# Patient Record
Sex: Male | Born: 2000
Health system: Southern US, Community
[De-identification: ages and names within clinical notes are randomized; demographics above are authoritative.]

## PROBLEM LIST (undated history)

## (undated) DIAGNOSIS — H539 Unspecified visual disturbance: Secondary | ICD-10-CM

---
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
## 2019-01-10 ENCOUNTER — Encounter (HOSPITAL_BASED_OUTPATIENT_CLINIC_OR_DEPARTMENT_OTHER): Payer: Self-pay | Admitting: *Deleted

## 2019-01-10 ENCOUNTER — Emergency Department (HOSPITAL_BASED_OUTPATIENT_CLINIC_OR_DEPARTMENT_OTHER)
Admission: EM | Admit: 2019-01-10 | Discharge: 2019-01-10 | Disposition: A | Discharge status: Home / Self Care | Payer: MEDICAID | Attending: Emergency Medicine | Admitting: Emergency Medicine

## 2019-01-10 ENCOUNTER — Other Ambulatory Visit: Payer: Self-pay

## 2019-01-10 ENCOUNTER — Emergency Department (HOSPITAL_BASED_OUTPATIENT_CLINIC_OR_DEPARTMENT_OTHER): Payer: MEDICAID

## 2019-01-10 DIAGNOSIS — R69 Illness, unspecified: Secondary | ICD-10-CM

## 2019-01-10 DIAGNOSIS — F17200 Nicotine dependence, unspecified, uncomplicated: Secondary | ICD-10-CM | POA: Insufficient documentation

## 2019-01-10 DIAGNOSIS — J111 Influenza due to unidentified influenza virus with other respiratory manifestations: Secondary | ICD-10-CM | POA: Insufficient documentation

## 2019-01-10 LAB — INFLUENZA PANEL BY PCR (TYPE A & B)
Influenza A By PCR: NEGATIVE
Influenza B By PCR: NEGATIVE

## 2019-01-10 LAB — GROUP A STREP BY PCR: Group A Strep by PCR: NOT DETECTED

## 2019-01-10 MED ORDER — OSELTAMIVIR PHOSPHATE 75 MG PO CAPS: 75.0000 mg | ORAL_CAPSULE | Freq: Two times a day (BID) | ORAL | 0 refills | Status: DC

## 2019-01-10 MED ORDER — ACETAMINOPHEN 325 MG PO TABS
650.0000 mg | ORAL_TABLET | Freq: Once | ORAL | Status: AC
  Administered 2019-01-10: 650 mg via ORAL
  Filled 2019-01-10: qty 2

## 2019-01-10 MED ORDER — IBUPROFEN 800 MG PO TABS
800.0000 mg | ORAL_TABLET | Freq: Once | ORAL | Status: AC
  Administered 2019-01-10: 800 mg via ORAL
  Filled 2019-01-10: qty 1

## 2019-01-10 NOTE — ED Provider Notes (Signed)
[facility] EMERGENCY DEPARTMENT Provider Note   CSN: [id number] Arrival date & time: 01/10/19  1733    History   Chief Complaint Chief Complaint  Patient presents with  . Fever  . Generalized Body Aches    HPI Bryan Ayala is a 18 y.o. male with history of tobacco abuse who presents to the emergency department with complaints of flulike symptoms that began last evening and have been progressively worsening.  Patient reports congestion, sore throat, minimal productive cough, generalized body aches, subjective fever, and chills.  No alleviating or aggravating factors.  No intervention prior to arrival.  Denies recent travel or known exposures to Covid 19.  Denies nausea, vomiting, abdominal pain, diarrhea, chest pain, or dyspnea.     HPI  Past Medical History:  Diagnosis Date  . Vision abnormalities    wears glasses    Patient Active Problem List   Diagnosis Date Noted  . Post inflammatory hypopigmentation 04/23/2018    Past Surgical History:  Procedure Laterality Date  . APPENDECTOMY  05/13/2012   non-ruptured  . LAPAROSCOPIC APPENDECTOMY  05/12/2012   Procedure: APPENDECTOMY LAPAROSCOPIC;  Surgeon: Judie Petit. Leonia Corona, MD;  Location: [facility] OR;  Service: Pediatrics;  Laterality: N/A;        Home Medications    Prior to Admission medications   Not on File    Family History Family History  Problem Relation Age of Onset  . Asthma Maternal Aunt   . Cancer Maternal Aunt   . Alcohol abuse Maternal Uncle   . Alcohol abuse Paternal Uncle   . Cancer Maternal Grandmother        breast cancer  . Miscarriages / Stillbirths Maternal Grandmother   . Cancer Maternal Grandfather        prostate  . Hypertension Maternal Grandfather     Social History Social History   Tobacco Use  . Smoking status: Current Every Day Smoker  . Smokeless tobacco: Never Used  Substance Use Topics  . Alcohol use: No  . Drug use: No     Allergies   Patient has no known  allergies.   Review of Systems Review of Systems  Constitutional: Positive for chills and fever.  HENT: Positive for congestion and sore throat. Negative for ear pain.   Respiratory: Positive for cough. Negative for shortness of breath.   Cardiovascular: Negative for chest pain.  Gastrointestinal: Negative for abdominal pain, diarrhea, nausea and vomiting.  Musculoskeletal: Positive for myalgias.  All other systems reviewed and are negative.    Physical Exam Updated Vital Signs BP (!) 149/79   Pulse (!) 108   Temp (!) 103.2 F (39.6 C) (Oral)   Resp 20   Ht 6\' 1"  (1.854 m)   Wt 76.5 kg   SpO2 98%   BMI 22.24 kg/m   Physical Exam Vitals signs and nursing note reviewed.  Constitutional:      General: He is not in acute distress.    Appearance: He is well-developed. He is not toxic-appearing.  HENT:     Head: Normocephalic and atraumatic.     Right Ear: Ear canal and external ear normal. Tympanic membrane is not perforated, erythematous, retracted or bulging.     Left Ear: Ear canal and external ear normal. Tympanic membrane is not perforated, erythematous, retracted or bulging.     Ears:     Comments: No mastoid erythema, swelling, or tenderness to palpation.    Nose: Congestion present.     Right Sinus: No maxillary  sinus tenderness or frontal sinus tenderness.     Left Sinus: No maxillary sinus tenderness or frontal sinus tenderness.     Mouth/Throat:     Pharynx: Posterior oropharyngeal erythema present. No pharyngeal swelling or oropharyngeal exudate.     Comments: Posterior oropharynx is symmetric appearing. Patient tolerating own secretions without difficulty. No trismus. No drooling. No hot potato voice. No swelling beneath the tongue, submandibular compartment is soft.  Eyes:     General:        Right eye: No discharge.        Left eye: No discharge.     Conjunctiva/sclera: Conjunctivae normal.  Neck:     Musculoskeletal: Neck supple. No edema, neck rigidity  or crepitus.  Cardiovascular:     Rate and Rhythm: Regular rhythm. Tachycardia present.  Pulmonary:     Effort: Pulmonary effort is normal. No respiratory distress.     Breath sounds: Normal breath sounds. No wheezing, rhonchi or rales.  Abdominal:     General: There is no distension.     Palpations: Abdomen is soft.     Tenderness: There is no abdominal tenderness.  Lymphadenopathy:     Cervical: No cervical adenopathy.  Skin:    General: Skin is warm and dry.     Findings: No rash.  Neurological:     Mental Status: He is alert.     Comments: Clear speech.   Psychiatric:        Behavior: Behavior normal.    ED Treatments / Results  Labs (all labs ordered are listed, but only abnormal results are displayed) Labs Reviewed  GROUP A STREP BY PCR  INFLUENZA PANEL BY PCR (TYPE A & B)    EKG None  Radiology Dg Chest 2 View  Result Date: 01/10/2019 CLINICAL DATA:  Fever and body aches for 1 day. EXAM: CHEST - 2 VIEW COMPARISON:  None. FINDINGS: The cardiomediastinal silhouette is within normal limits. The lungs are well inflated and clear. There is no evidence of pleural effusion or pneumothorax. There is mild lower thoracic levoscoliosis. IMPRESSION: No active cardiopulmonary disease. Electronically Signed   By: Sebastian Ache M.D.   On: 01/10/2019 18:11    Procedures Procedures (including critical care time)  Medications Ordered in ED Medications  acetaminophen (TYLENOL) tablet 650 mg (650 mg Oral Given 01/10/19 1754)     Initial Impression / Assessment and Plan / ED Course  I have reviewed the triage vital signs and the nursing notes.  Pertinent labs & imaging results that were available during my care of the patient were reviewed by me and considered in my medical decision making (see chart for details).    Patient presents with flu like symptoms.  Patient is nontoxic appearing, in no apparent distress, febrile w/ likely resultant tachycardia upon arrival, BP also noted  to be elevated- doubt HTN emergency, discussed PCP recheck. No sinus tenderness, sxs < 7 days, doubt acute bacterial sinusitis. No AOM on exam. Strep negative, exam not consistent with RPA/PTA. Lungs CTA, CXR per triage negative for infiltrate, doubt pneumonia. Flu like illness, w/ tobacco use would be candidate for tamiflu- will prescribe w/ test as send out. Patient without travel or exposure to meet criteria for covid testing per current hospital protocol- will provide CDC quarantine information for precautionary measures. Motrin/tylenol for fever/dislcomfort at home. I discussed results, treatment plan, need for PCP follow-up, and return precautions with the patient. Provided opportunity for questions, patient confirmed understanding and is in agreement with plan.  Final Clinical Impressions(s) / ED Diagnoses   Final diagnoses:  Influenza-like illness    ED Discharge Orders         Ordered    oseltamivir (TAMIFLU) 75 MG capsule  Every 12 hours     01/10/19 1859           Gwenyth Dingee, Pleas Koch, PA-C 01/10/19 1921    Rolan Bucco, MD 01/10/19 [phone]

## 2019-01-10 NOTE — ED Notes (Signed)
NAD at this time. Pt is stable and going home.  

## 2019-01-10 NOTE — Discharge Instructions (Addendum)
You were seen in the emergency department today for flulike symptoms.  Your chest x-ray was normal.  Your strep test was negative.  We have sent off an influenza swab.  We are sending you home with Tamiflu for possible flu.  I will call you either tonight or tomorrow with your test results.  If positive we will recommend you take Tamiflu if negative this medication is not necessary.  At this time we have limited testing for coronavirus.  We were unable to test you for this today.  For precautionary measures we are recommending anyone with a fever and cough quarantined themselves for  14 days.  Please see the above instructions for quarantine.  Should you meet the following criteria your quarantine can be discontinued early: -At least 7 days have passed since symptoms first appeared AND - At least 3 days (72 hours) have passed since recovery defined as resolution of fever without the use of fever-reducing medications and improvement in respiratory symptoms (e.g., cough, shortness of breath);  Please take Tylenol and/or Motrin per over-the-counter dosing to help with fever and discomfort.  You may take over-the-counter decongestants as well.  Follow-up with primary care within 1 week for reevaluation.  Return to the ER for new or worsening symptoms including but not limited to worsening discomfort, trouble breathing, chest pain, or any other concerns.      Person Under Monitoring Name: Bryan Ayala  Location: [address] [place] [address]   Infection Prevention Recommendations for Individuals Confirmed to have, or Being Evaluated for, 2019 Novel Coronavirus (COVID-19) Infection Who Receive Care at Home  Individuals who are confirmed to have, or are being evaluated for, COVID-19 should follow the prevention steps below until a healthcare provider or local or state health department says they can return to normal activities.  Stay home except to get medical care You should restrict  activities outside your home, except for getting medical care. Do not go to work, school, or public areas, and do not use public transportation or taxis.  Call ahead before visiting your doctor Before your medical appointment, call the healthcare provider and tell them that you have, or are being evaluated for, COVID-19 infection. This will help the healthcare providers office take steps to keep other people from getting infected. Ask your healthcare provider to call the local or state health department.  Monitor your symptoms Seek prompt medical attention if your illness is worsening (e.g., difficulty breathing). Before going to your medical appointment, call the healthcare provider and tell them that you have, or are being evaluated for, COVID-19 infection. Ask your healthcare provider to call the local or state health department.  Wear a facemask You should wear a facemask that covers your nose and mouth when you are in the same room with other people and when you visit a healthcare provider. People who live with or visit you should also wear a facemask while they are in the same room with you.  Separate yourself from other people in your home As much as possible, you should stay in a different room from other people in your home. Also, you should use a separate bathroom, if available.  Avoid sharing household items You should not share dishes, drinking glasses, cups, eating utensils, towels, bedding, or other items with other people in your home. After using these items, you should wash them thoroughly with soap and water.  Cover your coughs and sneezes Cover your mouth and nose with a tissue when  you cough or sneeze, or you can cough or sneeze into your sleeve. Throw used tissues in a lined trash can, and immediately wash your hands with soap and water for at least 20 seconds or use an alcohol-based hand rub.  Wash your [employer] your hands often and thoroughly with soap and  water for at least 20 seconds. You can use an alcohol-based hand sanitizer if soap and water are not available and if your hands are not visibly dirty. Avoid touching your eyes, nose, and mouth with unwashed hands.   Prevention Steps for Caregivers and Household Members of Individuals Confirmed to have, or Being Evaluated for, COVID-19 Infection Being Cared for in the Home  If you live with, or provide care at home for, a person confirmed to have, or being evaluated for, COVID-19 infection please follow these guidelines to prevent infection:  Follow healthcare providers instructions Make sure that you understand and can help the patient follow any healthcare provider instructions for all care.  Provide for the patients basic needs You should help the patient with basic needs in the home and provide support for getting groceries, prescriptions, and other personal needs.  Monitor the patients symptoms If they are getting sicker, call his or her medical provider and tell them that the patient has, or is being evaluated for, COVID-19 infection. This will help the healthcare providers office take steps to keep other people from getting infected. Ask the healthcare provider to call the local or state health department.  Limit the number of people who have contact with the patient If possible, have only one caregiver for the patient. Other household members should stay in another home or place of residence. If this is not possible, they should stay in another room, or be separated from the patient as much as possible. Use a separate bathroom, if available. Restrict visitors who do not have an essential need to be in the home.  Keep older adults, very young children, and other sick people away from the patient Keep older adults, very young children, and those who have compromised immune systems or chronic health conditions away from the patient. This includes people with chronic heart,  lung, or kidney conditions, diabetes, and cancer.  Ensure good ventilation Make sure that shared spaces in the home have good air flow, such as from an air conditioner or an opened window, weather permitting.  Wash your hands often Wash your hands often and thoroughly with soap and water for at least 20 seconds. You can use an alcohol based hand sanitizer if soap and water are not available and if your hands are not visibly dirty. Avoid touching your eyes, nose, and mouth with unwashed hands. Use disposable paper towels to dry your hands. If not available, use dedicated cloth towels and replace them when they become wet.  Wear a facemask and gloves Wear a disposable facemask at all times in the room and gloves when you touch or have contact with the patients blood, body fluids, and/or secretions or excretions, such as sweat, saliva, sputum, nasal mucus, vomit, urine, or feces.  Ensure the mask fits over your nose and mouth tightly, and do not touch it during use. Throw out disposable facemasks and gloves after using them. Do not reuse. Wash your hands immediately after removing your facemask and gloves. If your personal clothing becomes contaminated, carefully remove clothing and launder. Wash your hands after handling contaminated clothing. Place all used disposable facemasks, gloves, and other waste in a lined  container before disposing them with other household waste. Remove gloves and wash your hands immediately after handling these items.  Do not share dishes, glasses, or other household items with the patient Avoid sharing household items. You should not share dishes, drinking glasses, cups, eating utensils, towels, bedding, or other items with a patient who is confirmed to have, or being evaluated for, COVID-19 infection. After the person uses these items, you should wash them thoroughly with soap and water.  Wash laundry thoroughly Immediately remove and wash clothes or bedding that  have blood, body fluids, and/or secretions or excretions, such as sweat, saliva, sputum, nasal mucus, vomit, urine, or feces, on them. Wear gloves when handling laundry from the patient. Read and follow directions on labels of laundry or clothing items and detergent. In general, wash and dry with the warmest temperatures recommended on the label.  Clean all areas the individual has used often Clean all touchable surfaces, such as counters, tabletops, doorknobs, bathroom fixtures, toilets, phones, keyboards, tablets, and bedside tables, every day. Also, clean any surfaces that may have blood, body fluids, and/or secretions or excretions on them. Wear gloves when cleaning surfaces the patient has come in contact with. Use a diluted bleach solution (e.g., dilute bleach with 1 part bleach and 10 parts water) or a household disinfectant with a label that says EPA-registered for coronaviruses. To make a bleach solution at home, add 1 tablespoon of bleach to 1 quart (4 cups) of water. For a larger supply, add  cup of bleach to 1 gallon (16 cups) of water. Read labels of cleaning products and follow recommendations provided on product labels. Labels contain instructions for safe and effective use of the cleaning product including precautions you should take when applying the product, such as wearing gloves or eye protection and making sure you have good ventilation during use of the product. Remove gloves and wash hands immediately after cleaning.  Monitor yourself for signs and symptoms of illness Caregivers and household members are considered close contacts, should monitor their health, and will be asked to limit movement outside of the home to the extent possible. Follow the monitoring steps for close contacts listed on the symptom monitoring form.   ? If you have additional questions, contact your local health department or call the epidemiologist on call at [phone] (available 24/7). ? This  guidance is subject to change. For the most up-to-date guidance from [facility], please refer to their website: [email or web address]

## 2019-01-10 NOTE — ED Triage Notes (Signed)
Fever and body aches x since last night. He took Tylenol this am.

## 2019-01-13 ENCOUNTER — Emergency Department (HOSPITAL_BASED_OUTPATIENT_CLINIC_OR_DEPARTMENT_OTHER)
Admission: EM | Admit: 2019-01-13 | Discharge: 2019-01-13 | Disposition: A | Discharge status: Home / Self Care | Payer: MEDICAID | Attending: Emergency Medicine | Admitting: Emergency Medicine

## 2019-01-13 ENCOUNTER — Encounter (HOSPITAL_BASED_OUTPATIENT_CLINIC_OR_DEPARTMENT_OTHER): Payer: Self-pay | Admitting: *Deleted

## 2019-01-13 ENCOUNTER — Other Ambulatory Visit: Payer: Self-pay

## 2019-01-13 DIAGNOSIS — B2790 Infectious mononucleosis, unspecified without complication: Secondary | ICD-10-CM

## 2019-01-13 DIAGNOSIS — F17210 Nicotine dependence, cigarettes, uncomplicated: Secondary | ICD-10-CM | POA: Insufficient documentation

## 2019-01-13 LAB — MONONUCLEOSIS SCREEN: Mono Screen: POSITIVE — AB

## 2019-01-13 LAB — CBC WITH DIFFERENTIAL/PLATELET
ABS IMMATURE GRANULOCYTES: 0.07 10*3/uL (ref 0.00–0.07)
Basophils Absolute: 0.1 10*3/uL (ref 0.0–0.1)
Basophils Relative: 0 %
EOS ABS: 0 10*3/uL (ref 0.0–0.5)
EOS PCT: 0 %
HEMATOCRIT: 44.2 % (ref 39.0–52.0)
HEMOGLOBIN: 14.9 g/dL (ref 13.0–17.0)
Immature Granulocytes: 1 %
Lymphocytes Relative: 42 %
Lymphs Abs: 6.4 10*3/uL — ABNORMAL HIGH (ref 0.7–4.0)
MCH: 29.6 pg (ref 26.0–34.0)
MCHC: 33.7 g/dL (ref 30.0–36.0)
MCV: 87.9 fL (ref 80.0–100.0)
MONO ABS: 2.9 10*3/uL — AB (ref 0.1–1.0)
MONOS PCT: 19 %
Neutro Abs: 5.8 10*3/uL (ref 1.7–7.7)
Neutrophils Relative %: 38 %
Platelets: 113 10*3/uL — ABNORMAL LOW (ref 150–400)
RBC: 5.03 MIL/uL (ref 4.22–5.81)
RDW: 13.1 % (ref 11.5–15.5)
Smear Review: NORMAL
WBC Morphology: >10
WBC: 15.2 10*3/uL — ABNORMAL HIGH (ref 4.0–10.5)
nRBC: 0 % (ref 0.0–0.2)

## 2019-01-13 MED ORDER — IBUPROFEN 400 MG PO TABS
400.0000 mg | ORAL_TABLET | Freq: Once | ORAL | Status: AC | PRN
  Administered 2019-01-13: 400 mg via ORAL
  Filled 2019-01-13: qty 1

## 2019-01-13 MED ORDER — NAPROXEN 375 MG PO TABS: ORAL_TABLET | ORAL | 1 refills | Status: AC

## 2019-01-13 NOTE — ED Notes (Signed)
Pt and mother understood dc material. NAD noted. Script sent in electronically. All questions answered to satisfaction. Pt and mother escorted to check out counter.

## 2019-01-13 NOTE — ED Provider Notes (Signed)
[facility] Provider Note: Bryan Dell, MD, FACEP  CSN: [id number] MRN: [id number] ARRIVAL: 01/13/19 at 0420 ROOM: [facility]   CHIEF COMPLAINT  Sore Throat   HISTORY OF PRESENT ILLNESS  01/13/19 4:34 AM Bryan Ayala is a 18 y.o. male with a 4-day history of sore throat, fever, headache and malaise.  He is also had nasal congestion and cough.  Symptoms are moderate to severe.  He has been taking Tylenol, ibuprofen and other over-the-counter medications without adequate relief of his fever.  He was noted to have a temperature of 103.3 on arrival and was given ibuprofen.  He was seen in the ED on the 19th of this month and tested for strep and influenza, all negative.  He also had a chest x-ray that was unremarkable.  He denies shortness of breath.   Past Medical History:  Diagnosis Date  . Vision abnormalities    wears glasses    Past Surgical History:  Procedure Laterality Date  . APPENDECTOMY  05/13/2012   non-ruptured  . LAPAROSCOPIC APPENDECTOMY  05/12/2012   Procedure: APPENDECTOMY LAPAROSCOPIC;  Surgeon: Judie Petit. Leonia Corona, MD;  Location: [facility] OR;  Service: Pediatrics;  Laterality: N/A;    Family History  Problem Relation Age of Onset  . Asthma Maternal Aunt   . Cancer Maternal Aunt   . Alcohol abuse Maternal Uncle   . Alcohol abuse Paternal Uncle   . Cancer Maternal Grandmother        breast cancer  . Miscarriages / Stillbirths Maternal Grandmother   . Cancer Maternal Grandfather        prostate  . Hypertension Maternal Grandfather     Social History   Tobacco Use  . Smoking status: Current Every Day Smoker  . Smokeless tobacco: Never Used  Substance Use Topics  . Alcohol use: No  . Drug use: No    Prior to Admission medications   Medication Sig Start Date End Date Taking? Authorizing Provider  oseltamivir (TAMIFLU) 75 MG capsule Take 1 capsule (75 mg total) by mouth every 12 (twelve) hours. 01/10/19   Petrucelli, Pleas Koch, PA-C     Allergies Patient has no known allergies.   REVIEW OF SYSTEMS  Negative except as noted here or in the History of Present Illness.   PHYSICAL EXAMINATION  Initial Vital Signs Blood pressure (!) 146/80, pulse (!) 111, temperature (!) 103.3 F (39.6 C), resp. rate 20, height 6\' 1"  (1.854 m), weight 76.2 kg, SpO2 97 %.  Examination General: Well-developed, well-nourished male in no acute distress; appearance consistent with age of record HENT: normocephalic; atraumatic; pharyngeal erythema, Tom's lower enlargement and exudate; uvula midline Eyes: pupils equal, round and reactive to light; extraocular muscles intact Neck: supple; anterior and posterior cervical lymphadenopathy Heart: regular rate and rhythm; tachycardia Lungs: clear to auscultation bilaterally Abdomen: soft; nondistended; nontender; no hepatosplenomegaly; bowel sounds present Extremities: No deformity; full range of motion; pulses normal Neurologic: Awake, alert and oriented; motor function intact in all extremities and symmetric; no facial droop Skin: Warm and dry Psychiatric: Normal mood and affect   RESULTS  Summary of this visit's results, reviewed by myself:   EKG Interpretation  Date/Time:    Ventricular Rate:    PR Interval:    QRS Duration:   QT Interval:    QTC Calculation:   R Axis:     Text Interpretation:        Laboratory Studies: Results for orders placed or performed during the hospital encounter of 01/13/19 (from  the past 24 hour(s))  Mononucleosis screen     Status: Abnormal   Collection Time: 01/13/19  4:49 AM  Result Value Ref Range   Mono Screen POSITIVE (A) NEGATIVE  CBC with Differential/Platelet     Status: Abnormal   Collection Time: 01/13/19  4:49 AM  Result Value Ref Range   WBC 15.2 (H) 4.0 - 10.5 K/uL   RBC 5.03 4.22 - 5.81 MIL/uL   Hemoglobin 14.9 13.0 - 17.0 g/dL   HCT 00.4 59.9 - 77.4 %   MCV 87.9 80.0 - 100.0 fL   MCH 29.6 26.0 - 34.0 pg   MCHC 33.7 30.0 - 36.0  g/dL   RDW 14.2 39.5 - 32.0 %   Platelets 113 (L) 150 - 400 K/uL   nRBC 0.0 0.0 - 0.2 %   Neutrophils Relative % 38 %   Neutro Abs 5.8 1.7 - 7.7 K/uL   Lymphocytes Relative 42 %   Lymphs Abs 6.4 (H) 0.7 - 4.0 K/uL   Monocytes Relative 19 %   Monocytes Absolute 2.9 (H) 0.1 - 1.0 K/uL   Eosinophils Relative 0 %   Eosinophils Absolute 0.0 0.0 - 0.5 K/uL   Basophils Relative 0 %   Basophils Absolute 0.1 0.0 - 0.1 K/uL   WBC Morphology >10% Reactive Benign Lymphoctyes    RBC Morphology MORPHOLOGY UNREMARKABLE    Smear Review Normal platelet morphology    Immature Granulocytes 1 %   Abs Immature Granulocytes 0.07 0.00 - 0.07 K/uL   Imaging Studies: No results found.  ED COURSE and MDM  Nursing notes and initial vitals signs, including pulse oximetry, reviewed.  Vitals:   01/13/19 0429 01/13/19 0430 01/13/19 0534  BP: (!) 146/80  129/67  Pulse: (!) 111  (!) 108  Resp: 20  20  Temp: (!) 103.3 F (39.6 C)  (!) 102.2 F (39 C)  SpO2: 97%  96%  Weight:  76.2 kg   Height:  6\' 1"  (1.854 m)    Examination and testing consistent with infectious mononucleosis.  Steroids are not recommended except with airway obstruction or severe thrombocytopenia (less than 20,000).  He was advised to return for difficulty breathing, left upper quadrant abdominal pain or abnormal bleeding.  He was advised he is contagious, particularly by saliva, and the disease may last 4 to 6 weeks.  PROCEDURES    ED DIAGNOSES     ICD-10-CM   1. Acute pharyngitis due to infectious mononucleosis B27.90        Bryan Kaufman, MD 01/13/19 [phone]

## 2019-01-13 NOTE — ED Triage Notes (Addendum)
C/o sore throat that started 3 days ago. C/o body aches and general ha. States he was seen a couple days ago for same symptoms. States his flu test was negative, and his strep test was negative, and chest xray was normal. States she has been taking tylenol and ibuprofen regular for his fever. Last tylenol was last night around 2200. Has been drinking fluids. Denies sob. 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 No known Covid 19 exposures.

## 2019-01-14 ENCOUNTER — Telehealth: Payer: Self-pay | Admitting: Family Medicine

## 2019-01-14 NOTE — Telephone Encounter (Signed)
Patient is scheduled to see PCP on Wednesday 01/16/2019

## 2019-01-14 NOTE — Telephone Encounter (Signed)
Copied from CRM [id number]. Topic: Quick Communication - See Telephone Encounter >> Jan 14, 2019  8:57 AM Darletta Moll L wrote: CRM for notification. See Telephone encounter for: 01/14/19. Patient's mom, Duwayne Heck, would like a call back regarding the patient's recent ED visits. Declined an appointment.

## 2019-01-16 ENCOUNTER — Ambulatory Visit: Payer: Self-pay | Admitting: Family Medicine

## 2019-01-16 ENCOUNTER — Telehealth (INDEPENDENT_AMBULATORY_CARE_PROVIDER_SITE_OTHER): Payer: Self-pay | Admitting: Family Medicine

## 2019-01-16 ENCOUNTER — Other Ambulatory Visit: Payer: Self-pay

## 2019-01-16 DIAGNOSIS — D696 Thrombocytopenia, unspecified: Secondary | ICD-10-CM

## 2019-01-16 DIAGNOSIS — B2790 Infectious mononucleosis, unspecified without complication: Secondary | ICD-10-CM

## 2019-01-16 MED ORDER — PREDNISONE 20 MG PO TABS: 40.0000 mg | ORAL_TABLET | Freq: Every day | ORAL | 0 refills | Status: AC

## 2019-01-16 NOTE — Telephone Encounter (Signed)
Called to change/make appt E Visit with PCP at 9:30.

## 2019-01-16 NOTE — Progress Notes (Signed)
Virtual Visit via Video Note  I connected with Bryan Ayala on 01/16/19 at  9:30 AM EDT by a video enabled telemedicine application and verified that I am speaking with the correct person using two identifiers.   I discussed the limitations of evaluation and management by telemedicine and the availability of in person appointments. The patient expressed understanding and agreed to proceed.  History of Present Illness: Pt dx'd with mono in ED. Thrombocytopenia also. Still having ST, myalgias, fever broke yesterday. Has been using NSAIDs, Tylenol, Mucinex early on. Has been pushing fluids and resting also.    Observations/Objective: Appears stated age.  No resp distress Age-appropriate judgment and insight  Assessment and Plan: Infectious mononucleosis without complication, infectious mononucleosis due to unspecified organism - Plan: predniSONE (DELTASONE) 20 MG tablet  Thrombocytopenia (HCC) - Plan: CBC w/Diff  Follow Up Instructions: 1 mo CBC as above. Pred today. Stop NSAIDS while on this. Supportive care.   I discussed the assessment and treatment plan with the patient. The patient was provided an opportunity to ask questions and all were answered. The patient agreed with the plan and demonstrated an understanding of the instructions.   The patient was advised to call back or seek an in-person evaluation if the symptoms worsen or if the condition fails to improve as anticipated.   Jilda Roche Healy, DO

## 2019-01-29 ENCOUNTER — Telehealth: Payer: Self-pay | Admitting: Family Medicine

## 2019-01-29 NOTE — Telephone Encounter (Signed)
Copied from CRM [id number]. Topic: Quick Communication - See Telephone Encounter >> Jan 29, 2019  3:22 PM Terisa Starr wrote: CRM for notification. See Telephone encounter for: 01/29/19.  Patient's mother, Duwayne Heck called and said that his coughing is not getting any better. She said he did a visit with Dr Carmelia Roller on 3/25. He has no fever and the sore throat has gone away. She said it is very dry. She said that she has bee giving him mucinex/ tylenol cold and flu , pushing fluids. She is wondering if Dr Carmelia Roller would call him in a script for a cough syrup. Please Advise. [phone]

## 2019-01-30 MED ORDER — PROMETHAZINE-DM 6.25-15 MG/5ML PO SYRP: 5.0000 mL | ORAL_SOLUTION | Freq: Four times a day (QID) | ORAL | 0 refills | Status: AC | PRN

## 2019-01-30 NOTE — Telephone Encounter (Signed)
Yes. Sent. Ty.

## 2019-01-30 NOTE — Telephone Encounter (Signed)
Called informed the mom prescription sent in.

## 2019-07-19 ENCOUNTER — Other Ambulatory Visit: Payer: Self-pay

## 2019-07-19 DIAGNOSIS — Z20822 Contact with and (suspected) exposure to covid-19: Secondary | ICD-10-CM

## 2019-07-20 LAB — NOVEL CORONAVIRUS, NAA: SARS-CoV-2, NAA: NOT DETECTED

## 2020-04-04 IMAGING — DX CHEST - 2 VIEW
2 series · 2 of 2 positions shown · non-contrast
Comparison: None.

CLINICAL DATA: Fever and body aches for 1 day.

EXAM:
CHEST - 2 VIEW

[chest pa]
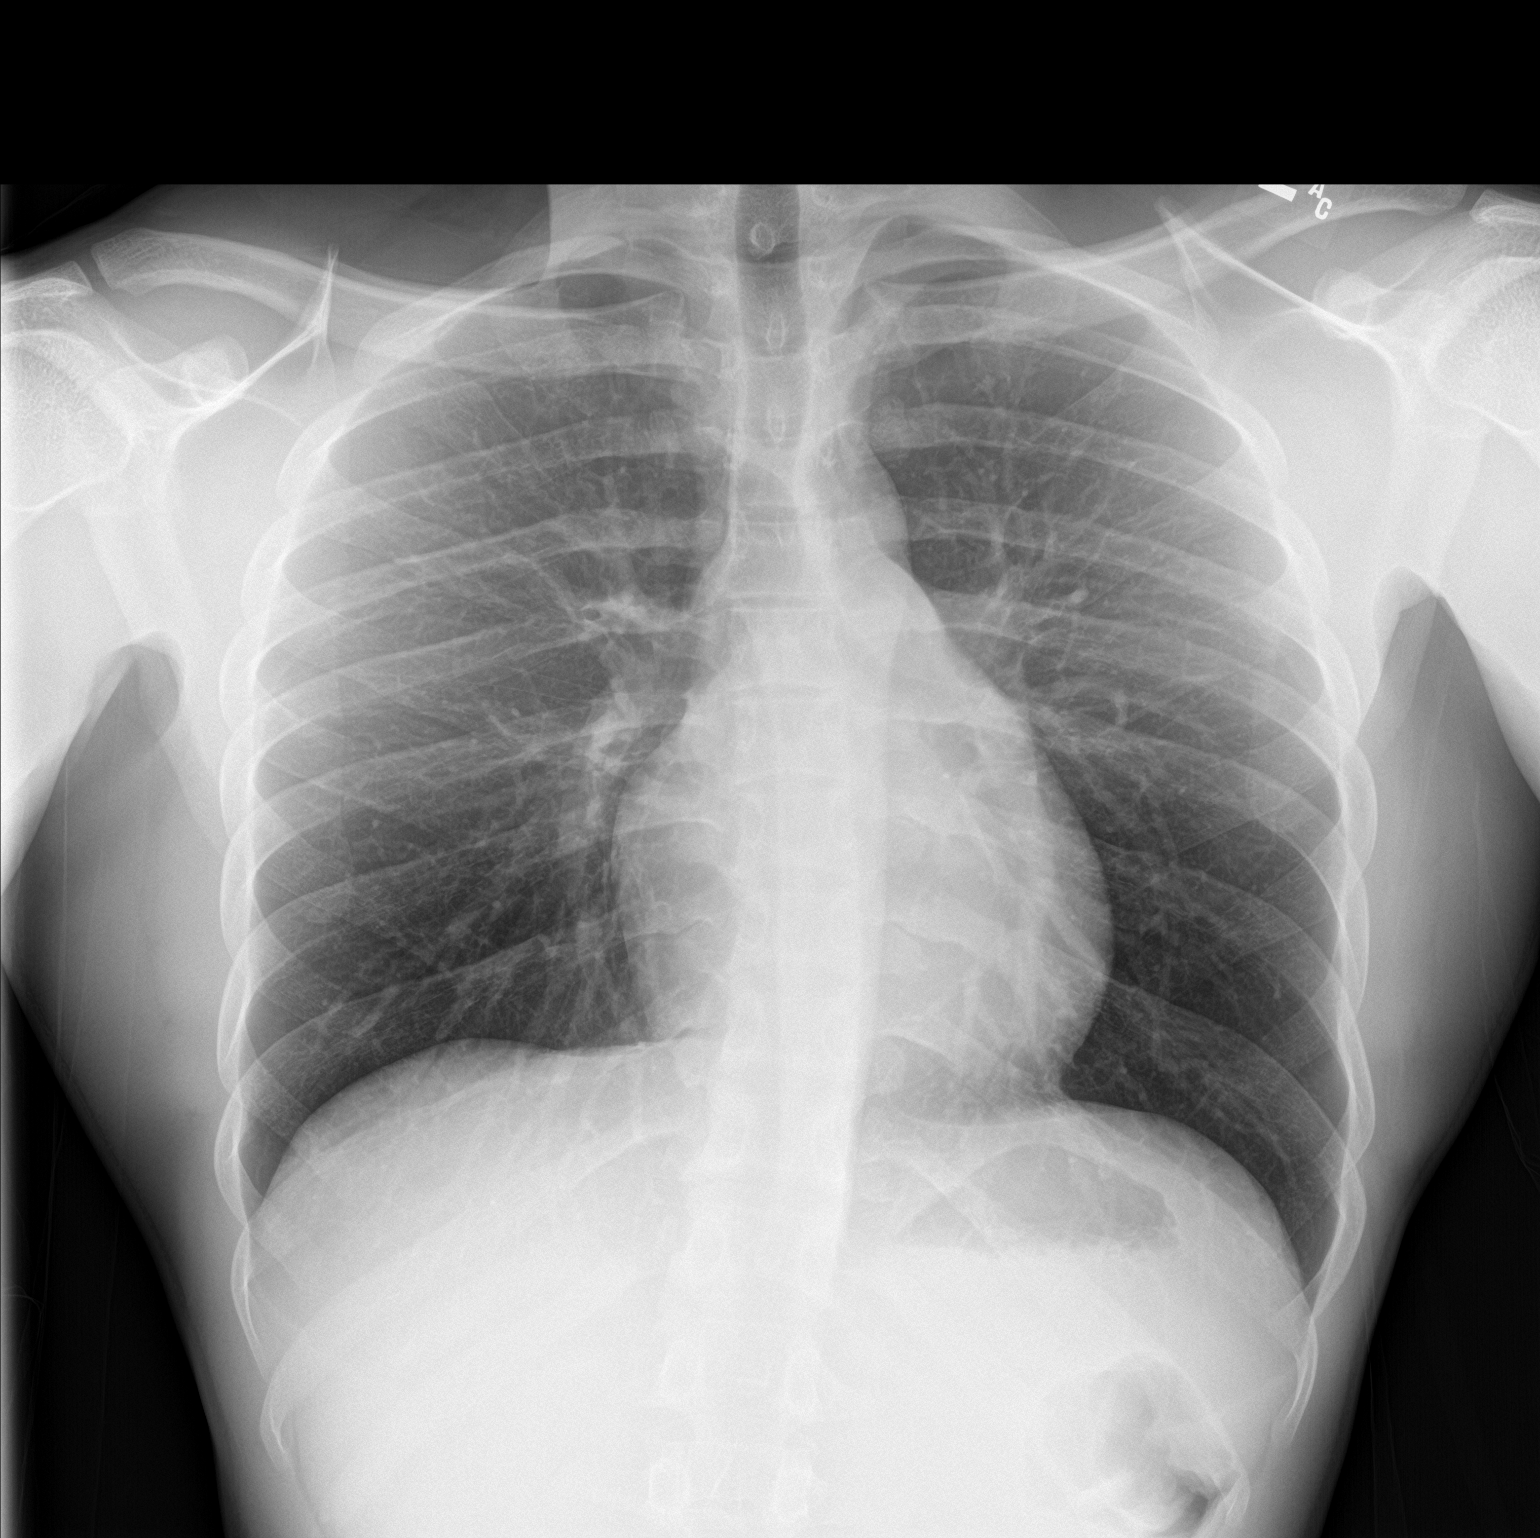

[chest lat]
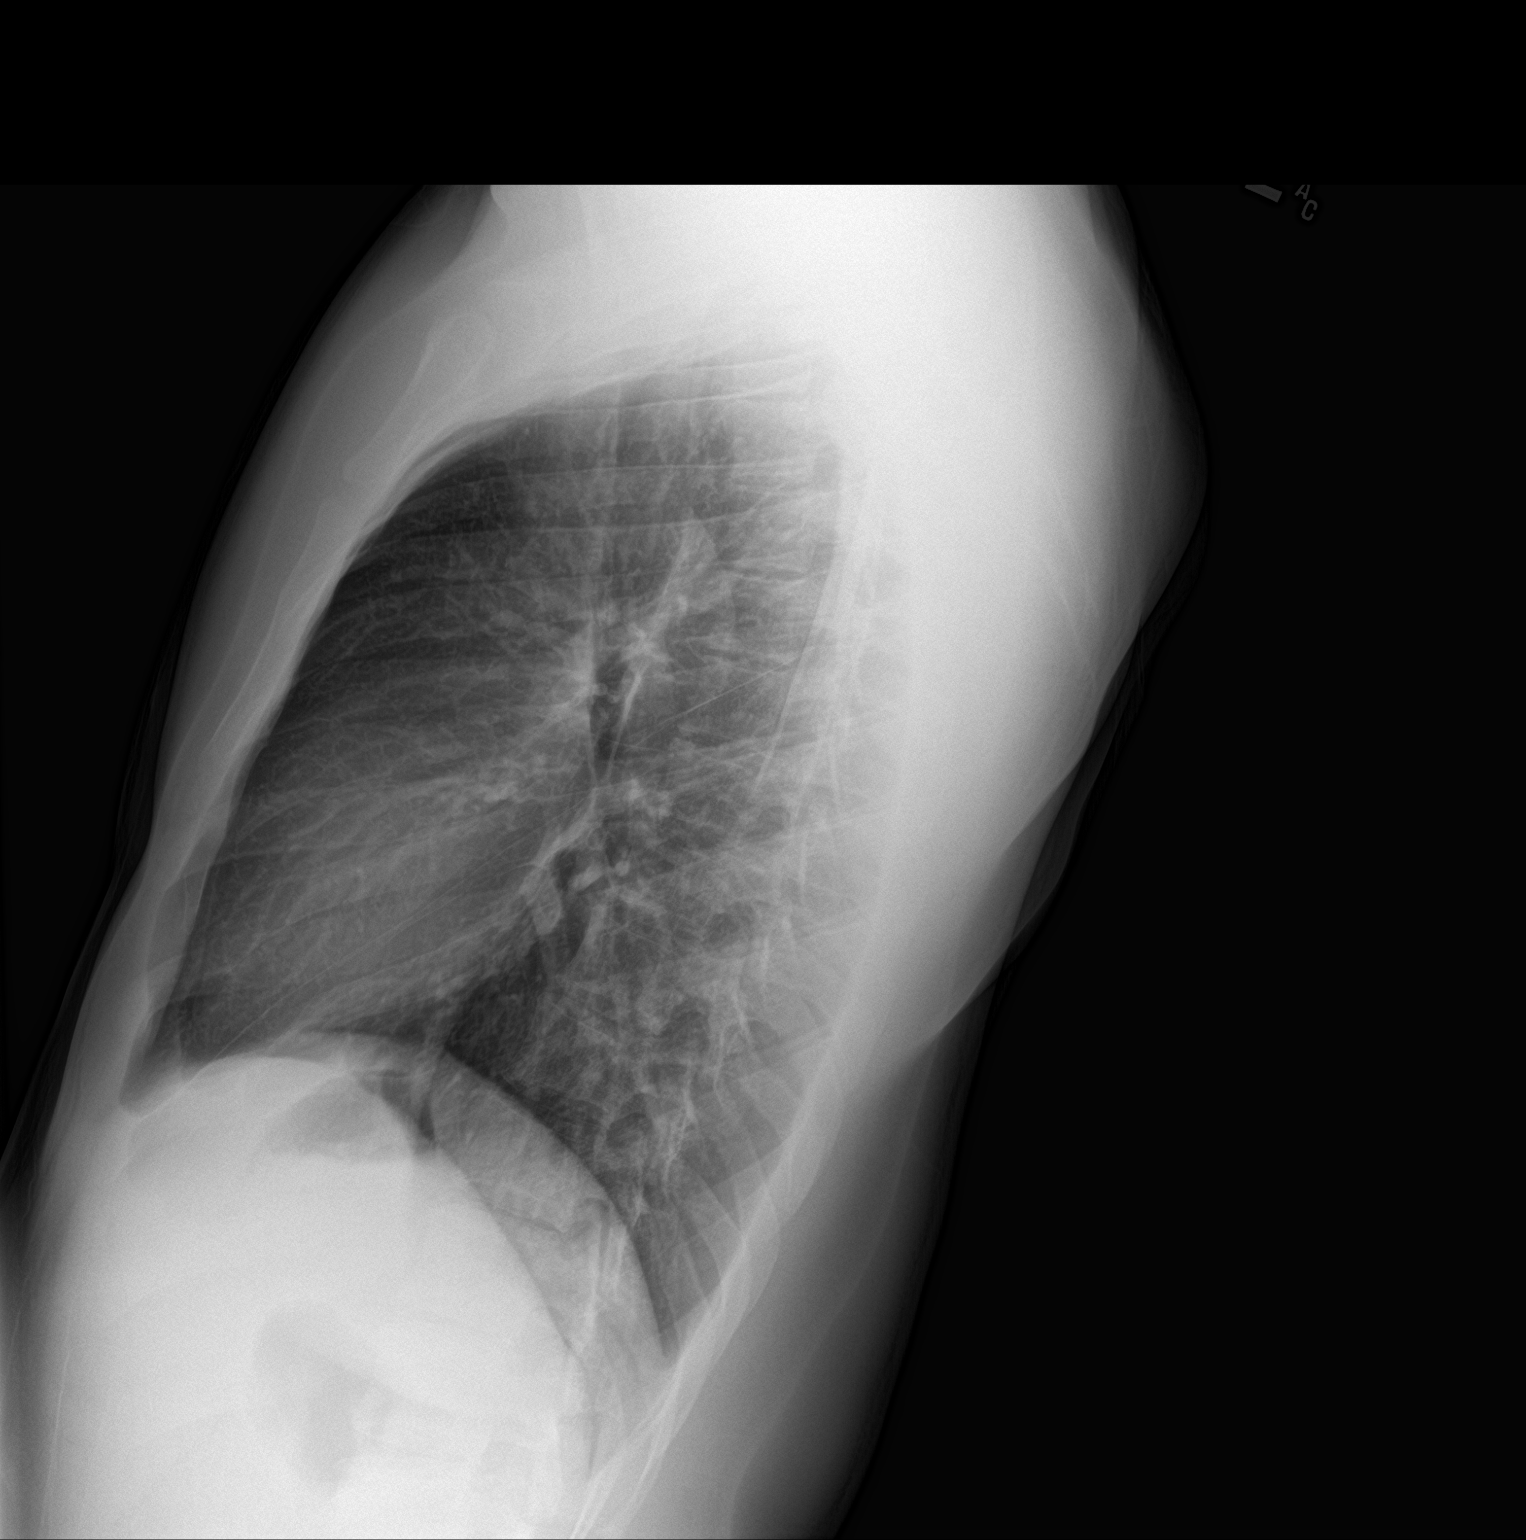

[2 of 2 positions shown; findings below may reference images not displayed]

FINDINGS: The cardiomediastinal silhouette is within normal limits. The lungs
are well inflated and clear. There is no evidence of pleural
effusion or pneumothorax. There is mild lower thoracic
levoscoliosis.
IMPRESSION: No active cardiopulmonary disease.

## 2024-03-08 ENCOUNTER — Emergency Department (HOSPITAL_COMMUNITY)
Admission: EM | Admit: 2024-03-08 | Discharge: 2024-03-08 | Disposition: A | Discharge status: Home / Self Care | Attending: Emergency Medicine | Admitting: Emergency Medicine

## 2024-03-08 ENCOUNTER — Ambulatory Visit: Payer: Self-pay

## 2024-03-08 ENCOUNTER — Encounter (HOSPITAL_COMMUNITY): Payer: Self-pay | Admitting: *Deleted

## 2024-03-08 ENCOUNTER — Other Ambulatory Visit: Payer: Self-pay

## 2024-03-08 ENCOUNTER — Emergency Department (HOSPITAL_COMMUNITY)

## 2024-03-08 DIAGNOSIS — Y9371 Activity, boxing: Secondary | ICD-10-CM | POA: Diagnosis not present

## 2024-03-08 DIAGNOSIS — M25532 Pain in left wrist: Secondary | ICD-10-CM | POA: Diagnosis present

## 2024-03-08 DIAGNOSIS — S63502A Unspecified sprain of left wrist, initial encounter: Secondary | ICD-10-CM | POA: Insufficient documentation

## 2024-03-08 DIAGNOSIS — X500XXA Overexertion from strenuous movement or load, initial encounter: Secondary | ICD-10-CM | POA: Diagnosis not present

## 2024-03-08 MED ORDER — IBUPROFEN 800 MG PO TABS
800.0000 mg | ORAL_TABLET | Freq: Once | ORAL | Status: AC
  Administered 2024-03-08: 800 mg via ORAL
  Filled 2024-03-08: qty 1

## 2024-03-08 NOTE — ED Triage Notes (Signed)
 Here by POV from home for L wrist pain, injured yesterday boxing with a heavy bag, pinpoints pain to ulnar wrist and hypothenar hand. Some redness and minimal swelling reported. Rates 10/10 with movement, no pain at rest. No meds PTA. R hand dominant.

## 2024-03-08 NOTE — ED Provider Notes (Signed)
  EMERGENCY DEPARTMENT AT [facility] Provider Note   CSN: [id number] Arrival date & time: 03/08/24  7829     History  Chief Complaint  Patient presents with   Wrist Pain   HPI Bryan Ayala is a 23 y.o. male presenting for left wrist pain.  Occurred yesterday after boxing with a heavy bag.  Pain is about the ulnar wrist and extends into the proximal palm or hand.  Endorses some swelling and limited range of motion due to pain.  Denies tenderness about the radial aspect of the wrist.   Wrist Pain       Home Medications Prior to Admission medications   Medication Sig Start Date End Date Taking? Authorizing Provider  naproxen  (NAPROSYN ) 375 MG tablet Take 1 tablet twice daily as needed for fever or pain. 01/13/19   Molpus, Analeigha Nauman, MD  promethazine -dextromethorphan (PROMETHAZINE -DM) 6.25-15 MG/5ML syrup Take 5 mLs by mouth 4 (four) times daily as needed. 01/30/19   Jobe Mulder, DO      Allergies    Patient has no known allergies.    Review of Systems   See HPI  Physical Exam Updated Vital Signs BP (!) 151/81 (BP Location: Right Arm)   Pulse (!) 53   Temp 98.5 F (36.9 C) (Oral)   Resp 18   Wt 76.2 kg   SpO2 100%   BMI 22.16 kg/m  Physical Exam Constitutional:      Appearance: Normal appearance.  HENT:     Head: Normocephalic.     Nose: Nose normal.  Eyes:     Conjunctiva/sclera: Conjunctivae normal.  Pulmonary:     Effort: Pulmonary effort is normal.  Musculoskeletal:     Right wrist: Normal.     Left wrist: Tenderness present. No swelling, deformity, bony tenderness, snuff box tenderness or crepitus. Normal range of motion. Normal pulse.     Comments: Tenderness with palpation to ulnar aspect of the left wrist.  No deformity noted.  No snuff tenderness.  No obvious swelling or erythema.  Range of motion appears to be intact.  Neurological:     Mental Status: He is alert.  Psychiatric:        Mood and Affect: Mood normal.      ED Results / Procedures / Treatments   Labs (all labs ordered are listed, but only abnormal results are displayed) Labs Reviewed - No data to display  EKG None  Radiology DG Wrist Complete Left Result Date: 03/08/2024 CLINICAL DATA:  Left wrist injury with pain and swelling EXAM: LEFT WRIST - COMPLETE 3+ VIEW COMPARISON:  None Available. FINDINGS: There is no evidence of fracture or dislocation. There is no evidence of arthropathy or other focal bone abnormality. Soft tissues are unremarkable. IMPRESSION: Negative. Electronically Signed   By: Marlyce Sine M.D.   On: 03/08/2024 10:52    Procedures Procedures    Medications Ordered in ED Medications  ibuprofen  (ADVIL ) tablet 800 mg (has no administration in time range)    ED Course/ Medical Decision Making/ A&P                                 Medical Decision Making Amount and/or Complexity of Data Reviewed Radiology: ordered.   23 year old well-appearing male presenting for left wrist pain after boxing.  Exam was unremarkable and overall the wrist appeared grossly normal and neurovascularly intact.  X-ray was negative for fracture or dislocation and  patient did not have snuffbox tenderness.  Compartments are soft and hand and wrist are warm and well perfused. Gave ibuprofen , applied ice and Ace wrap.  Advised RICE treatment at home with NSAIDs and follow-up with PCP.  Discussed return precautions.  Discharged in good condition.        Final Clinical Impression(s) / ED Diagnoses Final diagnoses:  Sprain of left wrist, initial encounter    Rx / DC Orders ED Discharge Orders     None         Janalee Mcmurray, PA-C 03/08/24 1104    Burnette Carte, MD 03/08/24 1420

## 2024-03-08 NOTE — Telephone Encounter (Signed)
 Copied from CRM [id number]. Topic: Clinical - Red Word Triage >> Mar 08, 2024  9:15 AM Marissa P wrote: Red Word that prompted transfer to Nurse Triage: Patient has pain in wrist and hurts more when trying to move it forward. Sprained wrist while playing boxing yesterday. New patient would like to be seen as soon as possible please  Chief Complaint: boxing yesterday and injured left wrist Symptoms: pain, swelling, redness, warmth Frequency: constant Pertinent Negatives: Patient denies fever, sob Disposition: [x] ED /[] Urgent Care (no appt availability in office) / [] Appointment(In office/virtual)/ []  [place] [facility]/ [] Home Care/ [] Refused Recommended Disposition /[] [place] [facility]/ []  Follow-up with PCP Additional Notes: instructed to go to the UC; pcp office updated.   Reason for Disposition  Severe pain when tries to move wrist  Answer Assessment - Initial Assessment Questions 1. MECHANISM: "How did the injury happen?"      boxing 2. WHEN: "When did the injury happen?" (Minutes or hours ago)      yesterday 3. LOCATION: "Which wrist or hand is injured?"     Left write 4. APPEARANCE of INJURY: "What does the injury look like?"      Swollen, red, warm, states looks weird 5. SEVERITY: "Can your child move the wrist or hand normally?" For wrist, can rotate palm up and down and move the hand up and down (wrist flexion/extension). For hand, can make a fist and open it straight.     Can't move it forward because it hurts 6. SIZE: For bruises or swelling, ask: "How large is it?" (Inches or centimeters)      swelling 7. PAIN: "Is there pain?" If so, ask: "How bad is the pain?"      severe 8. TETANUS: For any breaks in the skin, ask: "When was the last tetanus booster?"     na  Protocols used: Wrist or Hand Injury-P-AH

## 2024-03-08 NOTE — Discharge Instructions (Addendum)
 Evaluation today was overall reassuring.  X-ray was negative for fracture or dislocation.  Recommend conservative treatment which includes rest, ice 3-4 times a day, compression and elevation.  You can also take ibuprofen  and Tylenol  for pain and symptomatic relief.  If you develop worsening swelling, reduced range of motion, numbness in your hand or fingers or any other concerning symptom please return to the ED for further evaluation.  Otherwise recommend follow-up with your PCP.
